# Patient Record
Sex: Male | Born: 1995 | Race: White | Hispanic: No | Marital: Single | Smoking: Former smoker
Health system: Southern US, Community
[De-identification: ages and names within clinical notes are randomized; demographics above are authoritative.]

## PROBLEM LIST (undated history)

## (undated) DIAGNOSIS — J302 Other seasonal allergic rhinitis: Secondary | ICD-10-CM

## (undated) HISTORY — PX: TONSILLECTOMY: SUR1361

## (undated) HISTORY — PX: WRIST SURGERY: SHX841

---
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
## 2013-05-13 IMAGING — CR DG ANKLE COMPLETE 3+V*L*
3 series · 3 of 3 positions shown · non-contrast
Comparison: None.

CLINICAL DATA: Inversion ankle injury.  Pain, swelling.

LEFT ANKLE COMPLETE - 3+ VIEW

[view not recorded (1 of 3)]
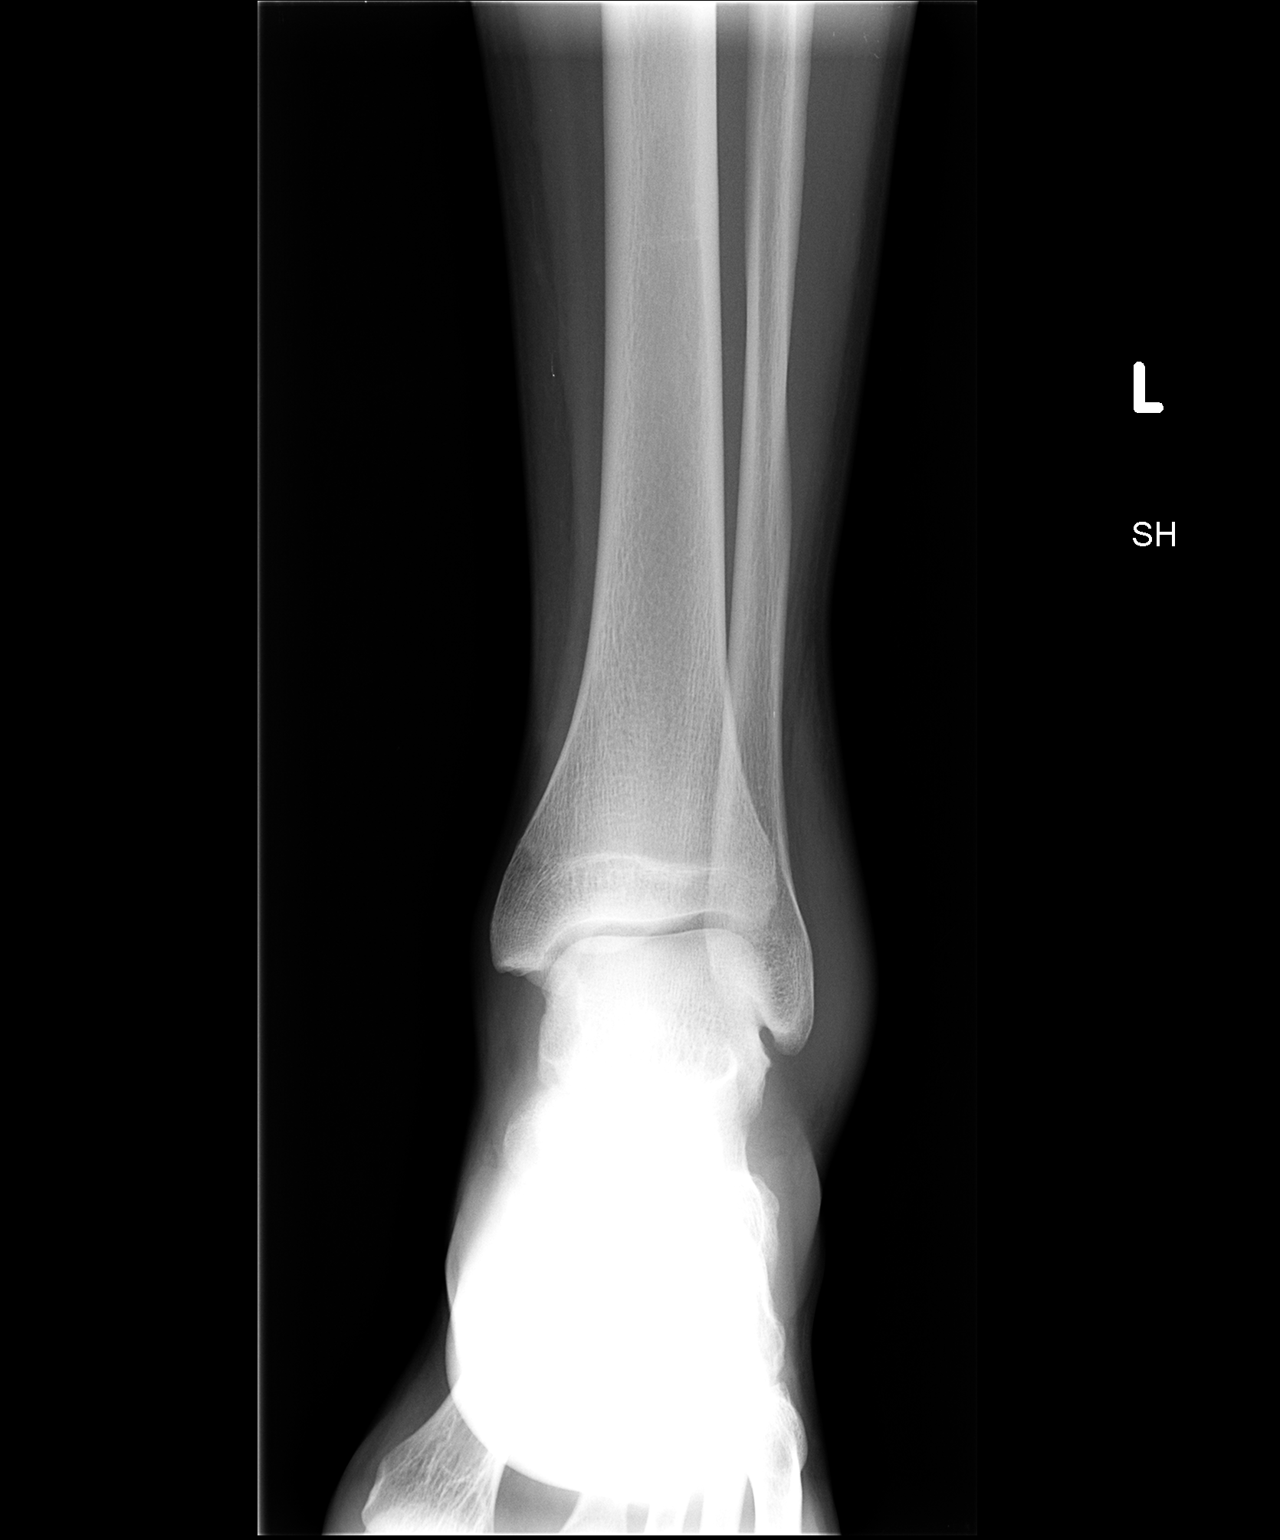

[view not recorded (2 of 3)]
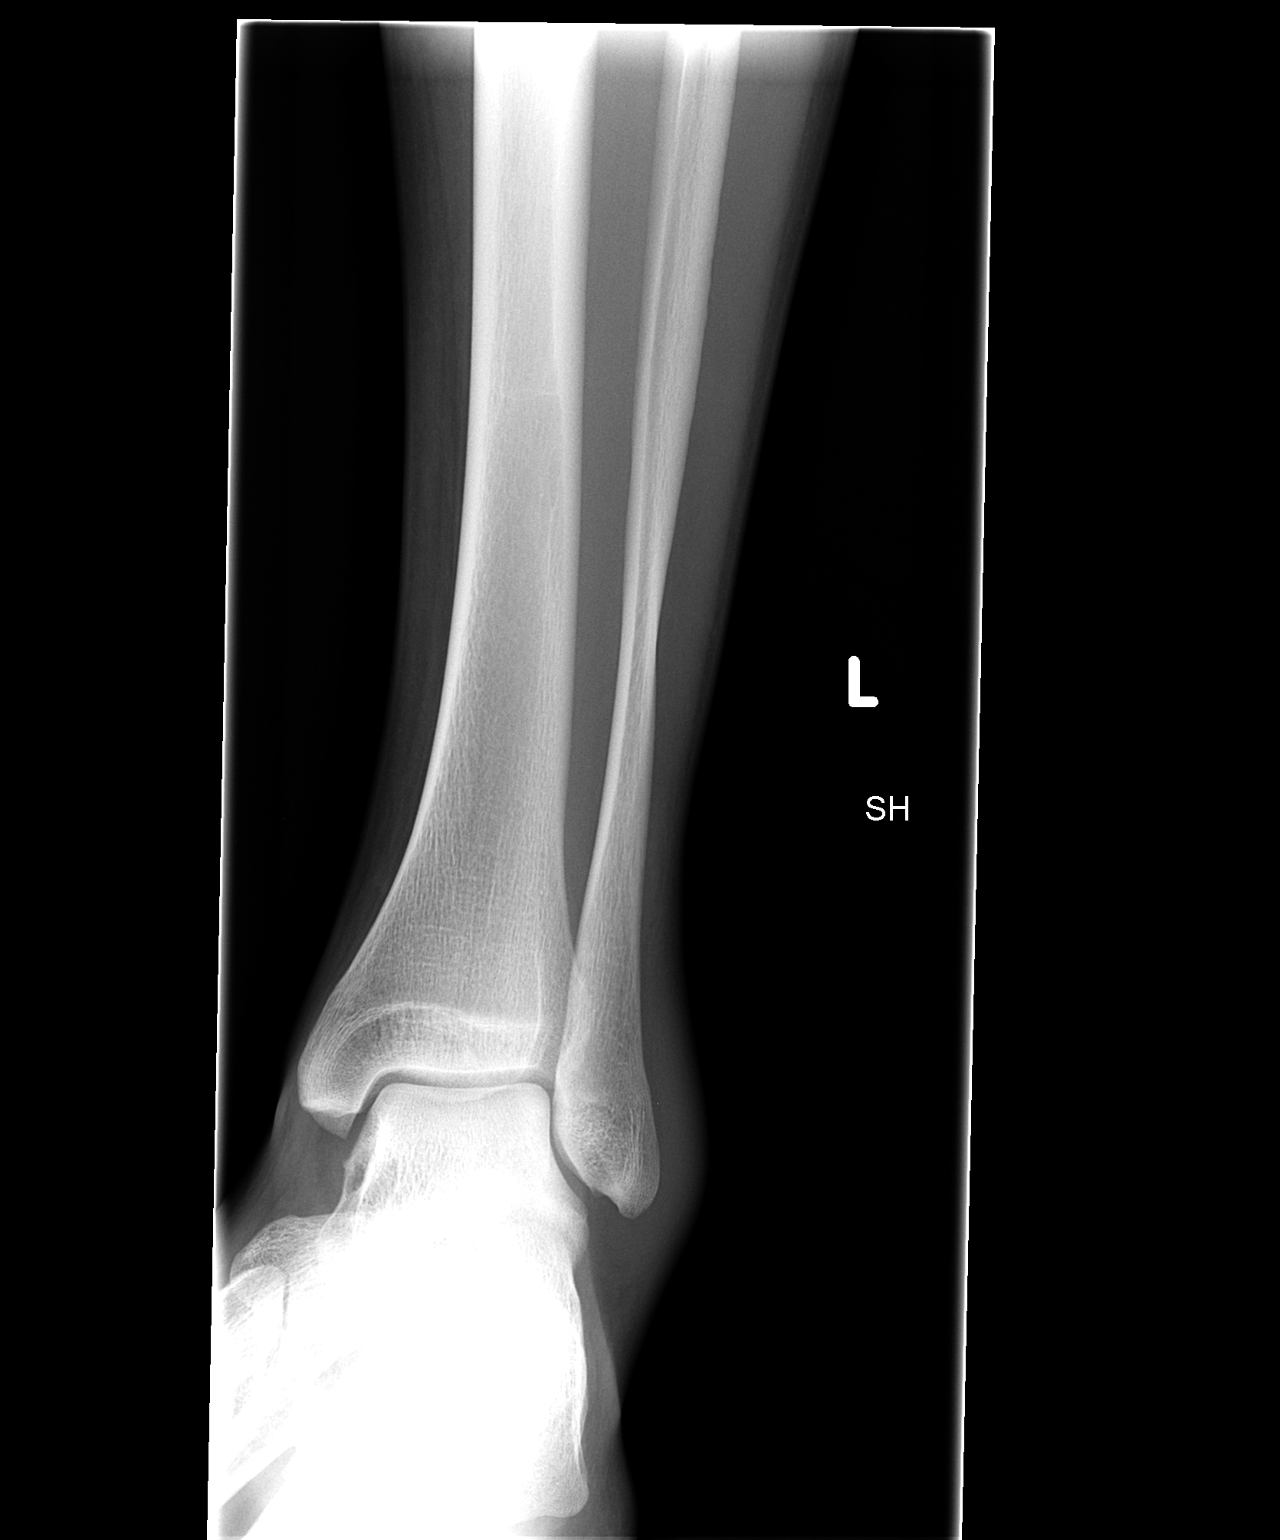

[view not recorded (3 of 3)]
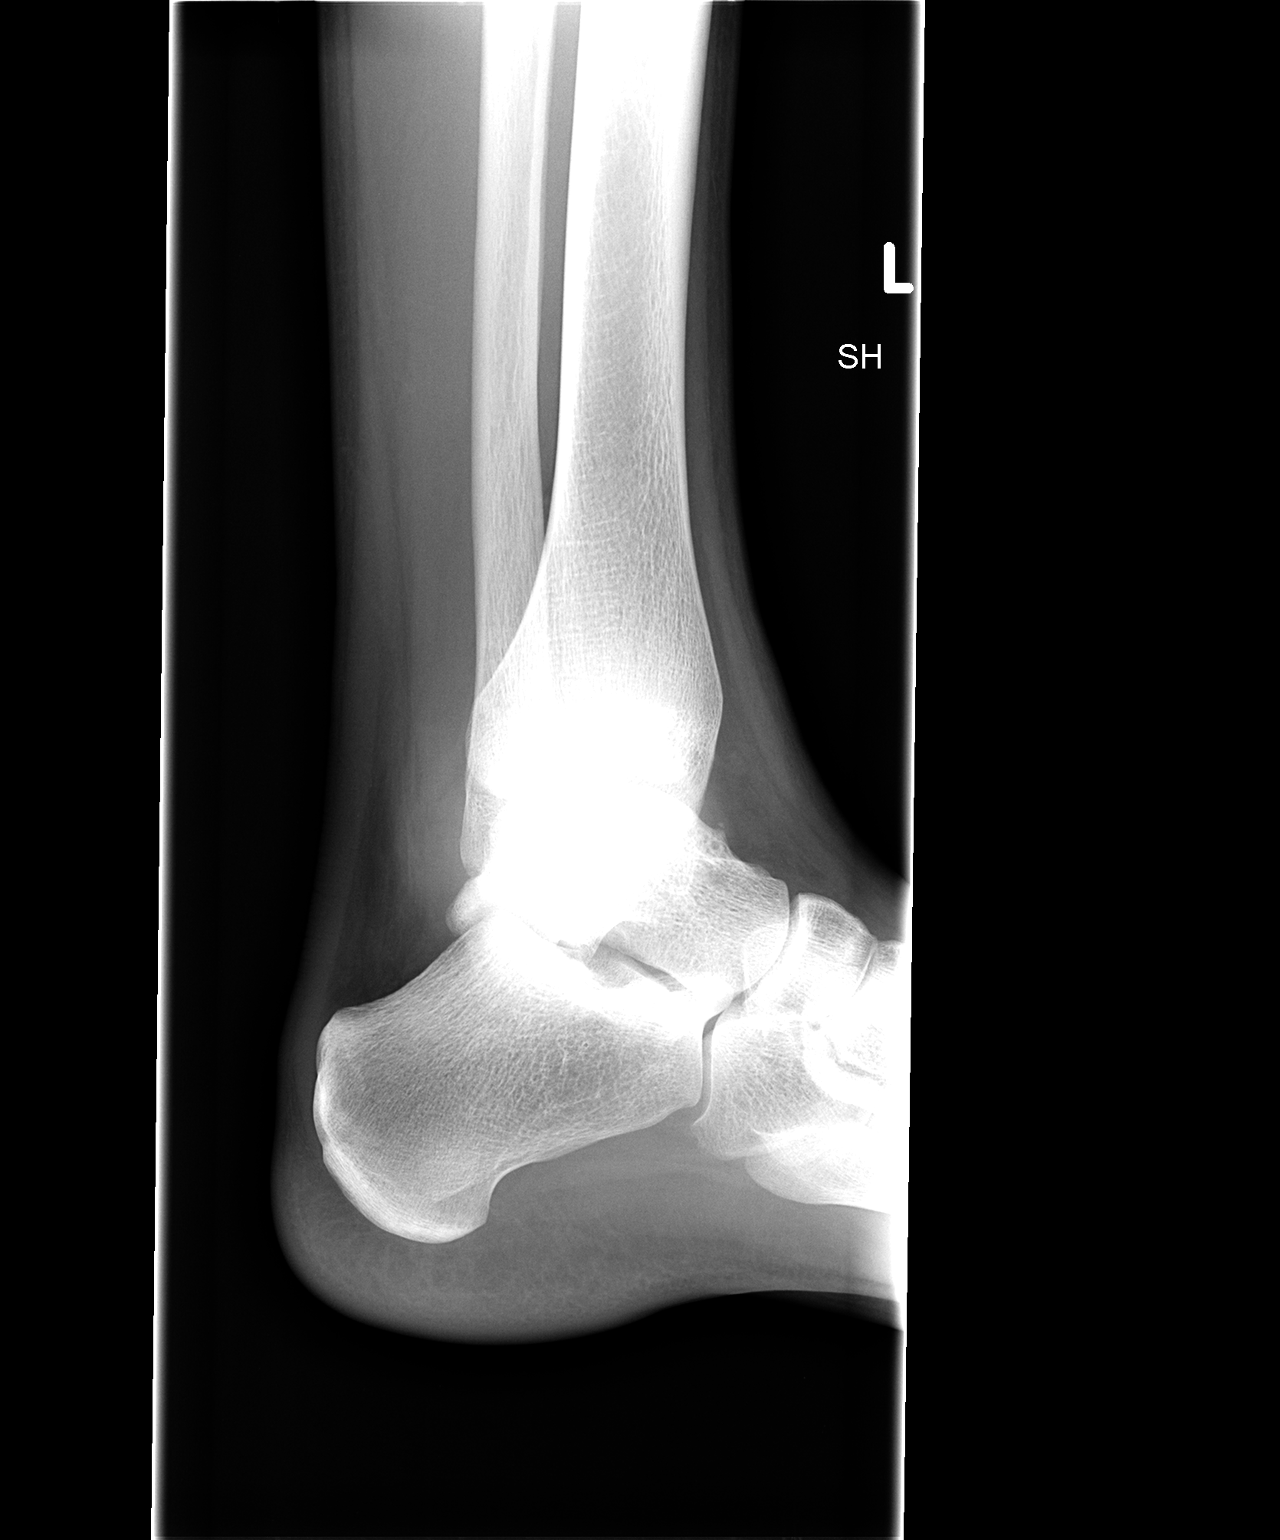

[3 of 3 positions shown; findings below may reference images not displayed]

FINDINGS: Lateral soft tissues.  No underlying bony fracture,
subluxation or dislocation.
IMPRESSION: No acute bony abnormality.

## 2016-03-06 ENCOUNTER — Encounter: Payer: Self-pay | Admitting: Emergency Medicine

## 2016-03-06 ENCOUNTER — Emergency Department
Admission: EM | Admit: 2016-03-06 | Discharge: 2016-03-06 | Disposition: A | Discharge status: Home / Self Care | Payer: Managed Care, Other (non HMO) | Source: Home / Self Care | Attending: Family Medicine | Admitting: Family Medicine

## 2016-03-06 DIAGNOSIS — J019 Acute sinusitis, unspecified: Secondary | ICD-10-CM

## 2016-03-06 MED ORDER — AMOXICILLIN-POT CLAVULANATE 875-125 MG PO TABS: 1.0000 | ORAL_TABLET | Freq: Two times a day (BID) | ORAL | Status: DC

## 2016-03-06 MED ORDER — IBUPROFEN 600 MG PO TABS: 600.0000 mg | ORAL_TABLET | Freq: Four times a day (QID) | ORAL | Status: DC | PRN

## 2016-03-06 MED ORDER — FLUTICASONE PROPIONATE 50 MCG/ACT NA SUSP: 2.0000 | Freq: Every day | NASAL | Status: DC

## 2016-03-06 MED ORDER — PSEUDOEPHEDRINE HCL 60 MG PO TABS: 60.0000 mg | ORAL_TABLET | Freq: Four times a day (QID) | ORAL | Status: DC | PRN

## 2016-03-06 NOTE — Discharge Instructions (Signed)
You may take 400-600mg Ibuprofen (Motrin) every 6-8 hours for fever and pain  °Alternate with Tylenol  °You may take 500mg Tylenol every 4-6 hours as needed for fever and pain  °Follow-up with your primary care provider next week for recheck of symptoms if not improving.  °Be sure to drink plenty of fluids and rest, at least 8hrs of sleep a night, preferably more while you are sick. °Return urgent care or go to closest ER if you cannot keep down fluids/signs of dehydration, fever not reducing with Tylenol, difficulty breathing/wheezing, stiff neck, worsening condition, or other concerns (see below)  °Please take antibiotics as prescribed and be sure to complete entire course even if you start to feel better to ensure infection does not come back. ° °Sinus Rinse °WHAT IS A SINUS RINSE? °A sinus rinse is a home treatment. It rinses your sinuses with a mixture of salt and water (saline solution). Sinuses are air-filled spaces in your skull behind the bones of your face and forehead. They open into your nasal cavity. °To do a sinus rinse, you will need: °· Saline solution. °· Neti pot or spray bottle. This releases the saline solution into your nose and through your sinuses. You can buy neti pots and spray bottles at: °· Your local pharmacy. °· A health food store. °· Online. °WHEN WOULD I DO A SINUS RINSE?  °A sinus rinse can help to clear your nasal cavity. It can clear:  °· Mucus. °· Dirt. °· Dust. °· Pollen. °You may do a sinus rinse when you have: °· A cold. °· A virus. °· Allergies. °· A sinus infection. °· A stuffy nose. °If you are considering a sinus rinse: °· Ask your child's doctor before doing a sinus rinse on your child. °· Do not do a sinus rinse if you have had: °¨ Ear or nasal surgery. °¨ An ear infection. °¨ Blocked ears. °HOW DO I DO A SINUS RINSE?  °· Wash your hands. °· Disinfect your device using the directions that came with the device. °· Dry your device. °· Use the solution that comes with your  device or one that is sold separately in stores. Follow the mixing directions on the package. °· Fill your device with the amount of saline solution as stated in the device instructions. °· Stand over a sink and tilt your head sideways over the sink. °· Place the spout of the device in your upper nostril (the one closer to the ceiling). °· Gently pour or squeeze the saline solution into the nasal cavity. The liquid should drain to the lower nostril if you are not too congested. °· Gently blow your nose. Blowing too hard may cause ear pain. °· Repeat in the other nostril. °· Clean and rinse your device with clean water. °· Air-dry your device. °ARE THERE RISKS OF A SINUS RINSE?  °Sinus rinse is normally very safe and helpful. However, there are a few risks, which include:  °· A burning feeling in the sinuses. This may happen if you do not make the saline solution as instructed. Make sure to follow all directions when making the saline solution. °· Infection from unclean water. This is rare, but possible. °· Nasal irritation. °  °This information is not intended to replace advice given to you by your health care provider. Make sure you discuss any questions you have with your health care provider. °  °Document Released: 05/11/2014 Document Reviewed: 05/11/2014 °Elsevier Interactive Patient Education ©2016 Elsevier Inc. ° °Sinusitis, Adult °  Adult Sinusitis is redness, soreness, and puffiness (inflammation) of the air pockets in the bones of your face (sinuses). The redness, soreness, and puffiness can cause air and mucus to get trapped in your sinuses. This can allow germs to grow and cause an infection.  HOME CARE   Drink enough fluids to keep your pee (urine) clear or pale yellow.  Use a humidifier in your home.  Run a hot shower to create steam in the bathroom. Sit in the bathroom with the door closed. Breathe in the steam 3-4 times a day.  Put a warm, moist washcloth on your face 3-4 times a day, or as told by  your doctor.  Use salt water sprays (saline sprays) to wet the thick fluid in your nose. This can help the sinuses drain.  Only take medicine as told by your doctor. GET HELP RIGHT AWAY IF:   Your pain gets worse.  You have very bad headaches.  You are sick to your stomach (nauseous).  You throw up (vomit).  You are very sleepy (drowsy) all the time.  Your face is puffy (swollen).  Your vision changes.  You have a stiff neck.  You have trouble breathing. MAKE SURE YOU:   Understand these instructions.  Will watch your condition.  Will get help right away if you are not doing well or get worse.   This information is not intended to replace advice given to you by your health care provider. Make sure you discuss any questions you have with your health care provider.   Document Released: 04/01/2008 Document Revised: 11/04/2014 Document Reviewed: 05/19/2012 Elsevier Interactive Patient Education [employer]2016 Elsevier Inc.

## 2016-03-06 NOTE — ED Notes (Signed)
Sinus pain, pressure , headache, chills, sore throat x 3 days

## 2016-03-06 NOTE — ED Provider Notes (Signed)
CSN: [id number]     Arrival date & time 03/06/16  1103 History   First MD Initiated Contact with Patient 03/06/16 1126     Chief Complaint  Patient presents with  . Sinus Problem   (Consider location/radiation/quality/duration/timing/severity/associated sxs/prior Treatment) HPI The pt is a 20yo male presenting [facility] with c/o 3-4 days of gradually worsening sinus pain, pressure, chills, and sore throat. He notes his girlfriend was recently dx with sinusitis. Frontal headache is aching and throbbing, 2/10 at this time. This morning he had a temperature of 99*F.  He does not have medication at home so he has not tried anything yet.    Past Medical History  Diagnosis Date  . Seasonal allergies    History reviewed. No pertinent past surgical history. No family history on file. Social History  Substance Use Topics  . Smoking status: Current Every Day Smoker -- 0.50 packs/day    Types: Cigarettes  . Smokeless tobacco: None  . Alcohol Use: No    Review of Systems  Constitutional: Positive for fever, chills and fatigue. Negative for appetite change.  HENT: Positive for congestion, ear pain ( bilateral pressure), sinus pressure, sneezing and sore throat. Negative for voice change.   Respiratory: Negative for cough and shortness of breath.   Gastrointestinal: Negative for nausea, vomiting, abdominal pain and diarrhea.  Neurological: Positive for headaches. Negative for dizziness and light-headedness.    Allergies  Review of patient's allergies indicates no known allergies.  Home Medications   Prior to Admission medications   Medication Sig Start Date End Date Taking? Authorizing Provider  amoxicillin-clavulanate (AUGMENTIN) 875-125 MG tablet Take 1 tablet by mouth 2 (two) times daily. One po bid x 7 days 03/06/16   Junius FinnerErin O'Malley, PA-C  fluticasone [facility](FLONASE) 50 MCG/ACT nasal spray Place 2 sprays into both nostrils daily. 03/06/16   Junius FinnerErin O'Malley, PA-C  ibuprofen (ADVIL,MOTRIN) 600 MG  tablet Take 1 tablet (600 mg total) by mouth every 6 (six) hours as needed. 03/06/16   Junius FinnerErin O'Malley, PA-C  pseudoephedrine (SUDAFED) 60 MG tablet Take 1 tablet (60 mg total) by mouth every 6 (six) hours as needed. 03/06/16   Junius FinnerErin O'Malley, PA-C   Meds Ordered and Administered this Visit  Medications - No data to display  BP 100/69 mmHg  Pulse 81  Temp(Src) 98.7 F (37.1 C) (Oral)  Ht 6\' 1"  (1.854 m)  Wt 160 lb (72.576 kg)  BMI 21.11 kg/m2  SpO2 100% No data found.   Physical Exam  Constitutional: He appears well-developed and well-nourished. No distress.  HENT:  Head: Normocephalic and atraumatic.  Right Ear: Tympanic membrane normal.  Left Ear: Tympanic membrane normal.  Nose: Mucosal edema present. Right sinus exhibits maxillary sinus tenderness and frontal sinus tenderness. Left sinus exhibits maxillary sinus tenderness and frontal sinus tenderness.  Mouth/Throat: Uvula is midline and mucous membranes are normal. Posterior oropharyngeal erythema present. No oropharyngeal exudate, posterior oropharyngeal edema or tonsillar abscesses.  Eyes: Conjunctivae are normal. No scleral icterus.  Neck: Normal range of motion. Neck supple.  Cardiovascular: Normal rate, regular rhythm and normal heart sounds.   Pulmonary/Chest: Effort normal and breath sounds normal. No stridor. No respiratory distress. He has no wheezes. He has no rales.  Abdominal: Soft. He exhibits no distension. There is no tenderness.  Musculoskeletal: Normal range of motion.  Lymphadenopathy:    He has no cervical adenopathy.  Neurological: He is alert.  Skin: Skin is warm. He is diaphoretic.  Nursing note and vitals reviewed.   ED  Course  Procedures (including critical care time)  Labs Review Labs Reviewed - No data to display  Imaging Review No results found.    MDM   1. Acute rhinosinusitis    Pt c/o worsening sinus symptoms for 3 days. Girlfriend also sick with sinusitis.  Sinus tenderness noted on  exam. Pt reports temp of 99*F this morning, pt mildly diaphoretic on exam.  Rx: augmentin, sudafed, flonase, and motrin.  Home care instructions provided. F/u with PCP in 7-10 days if not improving, sooner if worsening. Patient verbalized understanding and agreement with treatment plan.     Junius Finner, PA-C 03/06/16 1158

## 2016-12-24 ENCOUNTER — Emergency Department
Admission: EM | Admit: 2016-12-24 | Discharge: 2016-12-24 | Disposition: A | Discharge status: Home / Self Care | Payer: Managed Care, Other (non HMO) | Source: Home / Self Care | Attending: Family Medicine | Admitting: Family Medicine

## 2016-12-24 ENCOUNTER — Encounter: Payer: Self-pay | Admitting: *Deleted

## 2016-12-24 DIAGNOSIS — J029 Acute pharyngitis, unspecified: Secondary | ICD-10-CM

## 2016-12-24 LAB — POCT RAPID STREP A (OFFICE): RAPID STREP A SCREEN: NEGATIVE

## 2016-12-24 MED ORDER — OSELTAMIVIR PHOSPHATE 75 MG PO CAPS: 75.0000 mg | ORAL_CAPSULE | Freq: Two times a day (BID) | ORAL | 0 refills | Status: AC

## 2016-12-24 MED ORDER — IBUPROFEN 600 MG PO TABS
600.0000 mg | ORAL_TABLET | Freq: Once | ORAL | Status: AC
  Administered 2016-12-24: 600 mg via ORAL

## 2016-12-24 NOTE — ED Triage Notes (Signed)
Patient c/o 2-3 days of sore throat. Awoke today with worsening sore throat, sweats, HA and fatigue.

## 2016-12-24 NOTE — ED Provider Notes (Signed)
[facility]    CSN: [id number] Arrival date & time: 12/24/16  1153     History   Chief Complaint Chief Complaint  Patient presents with  . Sore Throat    HPI Randy Andrade is a 21 y.o. male.   Complains of 3 day history flu-like illness including myalgias, headache, chills/sweats, fatigue, nasal congestion and sore throat.  No cough.  No pleuritic pain or shortness of breath.    The history is provided by the patient.    Past Medical History:  Diagnosis Date  . Seasonal allergies     There are no active problems to display for this patient.   Past Surgical History:  Procedure Laterality Date  . TONSILLECTOMY    . WRIST SURGERY         Home Medications    Prior to Admission medications   Medication Sig Start Date End Date Taking? Authorizing Provider  oseltamivir (TAMIFLU) 75 MG capsule Take 1 capsule (75 mg total) by mouth every 12 (twelve) hours. 12/24/16   Lattie Haw, MD    Family History History reviewed. No pertinent family history.  Social History Social History  Substance Use Topics  . Smoking status: Current Every Day Smoker    Packs/day: 0.50    Types: Cigarettes  . Smokeless tobacco: Never Used  . Alcohol use No     Allergies   Patient has no known allergies.   Review of Systems Review of Systems + sore throat No cough No pleuritic pain No wheezing + nasal congestion + post-nasal drainage No sinus pain/pressure No itchy/red eyes No earache No hemoptysis No SOB No fever, + chills/sweats No nausea No vomiting No abdominal pain No diarrhea No urinary symptoms No skin rash + fatigue + myalgias + headache Used OTC meds without relief   Physical Exam Triage Vital Signs ED Triage Vitals  Enc Vitals Group     BP 12/24/16 1217 100/66     Pulse Rate 12/24/16 1217 82     Resp 12/24/16 1217 14     Temp 12/24/16 1217 98.4 F (36.9 C)     Temp Source 12/24/16 1217 Oral     SpO2 12/24/16 1217 100 %   Weight 12/24/16 1218 158 lb (71.7 kg)     Height --      Head Circumference --      Peak Flow --      Pain Score 12/24/16 1218 7     Pain Loc --      Pain Edu? --      Excl. in GC? --    No data found.   Updated Vital Signs BP 100/66 (BP Location: Left Arm)   Pulse 82   Temp 98.4 F (36.9 C) (Oral)   Resp 14   Wt 158 lb (71.7 kg)   SpO2 100%   BMI 20.85 kg/m   Visual Acuity Right Eye Distance:   Left Eye Distance:   Bilateral Distance:    Right Eye Near:   Left Eye Near:    Bilateral Near:     Physical Exam Nursing notes and Vital Signs reviewed. Appearance:  Patient appears stated age, and in no acute distress Eyes:  Pupils are equal, round, and reactive to light and accomodation.  Extraocular movement is intact.  Conjunctivae are not inflamed  Ears:  Canals normal.  Tympanic membranes normal.  Nose:  Mildly congested turbinates.  No sinus tenderness.   Pharynx:  Uvula slightly erythematous Neck:  Supple.  Tender  enlarged posterior/lateral nodes are palpated bilaterally  Lungs:  Clear to auscultation.  Breath sounds are equal.  Moving air well. Heart:  Regular rate and rhythm without murmurs, rubs, or gallops.  Abdomen:  Nontender without masses or hepatosplenomegaly.  Bowel sounds are present.  No CVA or flank tenderness.  Extremities:  No edema.  Skin:  No rash present.    UC Treatments / Results  Labs (all labs ordered are listed, but only abnormal results are displayed) Labs Reviewed  POCT RAPID STREP A (OFFICE) negative    EKG  EKG Interpretation None       Radiology No results found.  Procedures Procedures (including critical care time)  Medications Ordered in UC Medications  ibuprofen (ADVIL,MOTRIN) tablet 600 mg (600 mg Oral Given 12/24/16 1219)     Initial Impression / Assessment and Plan / UC Course  I have reviewed the triage vital signs and the nursing notes.  Pertinent labs & imaging results that were available during my care of  the patient were reviewed by me and considered in my medical decision making (see chart for details).    ? Influenza. Begin empiric Tamiflu. Take plain guaifenesin (1200mg  extended release tabs such as Mucinex) twice daily, with plenty of water, for cough and congestion.  May add Pseudoephedrine (30mg , one or two every 4 to 6 hours) for sinus congestion.  Get adequate rest.   May take Delsym Cough Suppressant at bedtime for nighttime cough.  Try warm salt water gargles for sore throat.  Stop all antihistamines for now, and other non-prescription cough/cold preparations. May take Ibuprofen 200mg , 4 tabs every 8 hours with food for sore throat, fever, body aches, etc. Followup with Family Doctor if not improved in one week.     Final Clinical Impressions(s) / UC Diagnoses   Final diagnoses:  Pharyngitis, unspecified etiology    New Prescriptions New Prescriptions   OSELTAMIVIR (TAMIFLU) 75 MG CAPSULE    Take 1 capsule (75 mg total) by mouth every 12 (twelve) hours.     Lattie HawStephen A Yoali Conry, MD 12/24/16 1242

## 2016-12-24 NOTE — Discharge Instructions (Signed)
Take plain guaifenesin (1200mg  extended release tabs such as Mucinex) twice daily, with plenty of water, for cough and congestion.  May add Pseudoephedrine (30mg , one or two every 4 to 6 hours) for sinus congestion.  Get adequate rest.   May take Delsym Cough Suppressant at bedtime for nighttime cough.  Try warm salt water gargles for sore throat.  Stop all antihistamines for now, and other non-prescription cough/cold preparations. May take Ibuprofen 200mg , 4 tabs every 8 hours with food for sore throat, fever, body aches, etc.

## 2024-02-10 ENCOUNTER — Ambulatory Visit (INDEPENDENT_AMBULATORY_CARE_PROVIDER_SITE_OTHER)

## 2024-02-10 ENCOUNTER — Ambulatory Visit: Admitting: Sports Medicine

## 2024-02-10 ENCOUNTER — Ambulatory Visit: Admission: EM | Admit: 2024-02-10 | Discharge: 2024-02-10 | Disposition: A | Discharge status: Home / Self Care

## 2024-02-10 ENCOUNTER — Encounter: Payer: Self-pay | Admitting: Sports Medicine

## 2024-02-10 VITALS — BP 110/79 | Ht 73.0 in | Wt 160.0 lb

## 2024-02-10 DIAGNOSIS — Y9366 Activity, soccer: Secondary | ICD-10-CM

## 2024-02-10 DIAGNOSIS — S99912A Unspecified injury of left ankle, initial encounter: Secondary | ICD-10-CM

## 2024-02-10 DIAGNOSIS — M25572 Pain in left ankle and joints of left foot: Secondary | ICD-10-CM

## 2024-02-10 DIAGNOSIS — W228XXA Striking against or struck by other objects, initial encounter: Secondary | ICD-10-CM

## 2024-02-10 MED ORDER — CELECOXIB 200 MG PO CAPS: 200.0000 mg | ORAL_CAPSULE | Freq: Every day | ORAL | 0 refills | Status: AC

## 2024-02-10 NOTE — ED Triage Notes (Addendum)
 [facility] with [facility] left ankle injury while playing soccer with his daughter on sunday. Pt reports pain is a tingling sensation and comes and goes. Pt reports he took otc pain meds and RICE

## 2024-02-10 NOTE — ED Provider Notes (Signed)
 [facility]    CSN: [id number] Arrival date & time: 02/10/24  1011      History   Chief Complaint Chief Complaint  Patient presents with   Ankle Pain    HPI Randy Andrade is a 28 y.o. male.   HPI Randy Andrade 28 year old male presents with left ankle pain secondary to left ankle injury while playing soccer with his daughter on Sunday, 02/08/2024.  Patient reports taking OTC pain medication and RICE to left ankle.  Past Medical History:  Diagnosis Date   Seasonal allergies     There are no active problems to display for this patient.   Past Surgical History:  Procedure Laterality Date   TONSILLECTOMY     WRIST SURGERY         Home Medications    Prior to Admission medications   Medication Sig Start Date End Date Taking? Authorizing Provider  amphetamine-dextroamphetamine (ADDERALL XR) 30 MG 24 hr capsule Take 30 mg by mouth daily. 01/17/24  Yes [provider]  amphetamine-dextroamphetamine (ADDERALL) 30 MG tablet Take 1 tablet by mouth daily. 01/17/24  Yes [provider]  celecoxib (CELEBREX) 200 MG capsule Take 1 capsule (200 mg total) by mouth daily for 15 days. 02/10/24 02/25/24 Yes Leonides Ramp, FNP  oseltamivir (TAMIFLU) 75 MG capsule Take 1 capsule (75 mg total) by mouth every 12 (twelve) hours. Patient not taking: Reported on 02/10/2024 12/24/16   Leon Rajas, MD    Family History Family History  Problem Relation Age of Onset   Healthy Mother    Healthy Father     Social History Social History   Tobacco Use   Smoking status: Former    Current packs/day: 0.50    Types: Cigarettes   Smokeless tobacco: Never  Substance Use Topics   Alcohol use: No   Drug use: No     Allergies   Patient has no known allergies.   Review of Systems Review of Systems  Musculoskeletal:        Left ankle pain secondary to left ankle injury on Sunday, 02/08/2024     Physical Exam Triage Vital Signs ED Triage Vitals  Encounter  Vitals Group     BP 02/10/24 1046 110/79     Systolic BP Percentile --      Diastolic BP Percentile --      Pulse Rate 02/10/24 1046 80     Resp 02/10/24 1046 16     Temp 02/10/24 1046 98.2 F (36.8 C)     Temp src --      SpO2 02/10/24 1046 98 %     Weight --      Height --      Head Circumference --      Peak Flow --      Pain Score 02/10/24 1044 3     Pain Loc --      Pain Education --      Exclude from Growth Chart --    No data found.  Updated Vital Signs BP 110/79   Pulse 80   Temp 98.2 F (36.8 C)   Resp 16   SpO2 98%    Physical Exam Vitals and nursing note reviewed.  Constitutional:      Appearance: Normal appearance. He is normal weight.  HENT:     Head: Normocephalic and atraumatic.     Mouth/Throat:     Mouth: Mucous membranes are moist.     Pharynx: Oropharynx is clear.  Eyes:  Extraocular Movements: Extraocular movements intact.     Conjunctiva/sclera: Conjunctivae normal.     Pupils: Pupils are equal, round, and reactive to light.  Cardiovascular:     Rate and Rhythm: Normal rate and regular rhythm.     Pulses: Normal pulses.     Heart sounds: Normal heart sounds.  Pulmonary:     Effort: Pulmonary effort is normal.     Breath sounds: Normal breath sounds. No wheezing, rhonchi or rales.  Musculoskeletal:        General: Normal range of motion.  Skin:    General: Skin is warm and dry.  Neurological:     General: No focal deficit present.     Mental Status: He is alert and oriented to person, place, and time. Mental status is at baseline.  Psychiatric:        Mood and Affect: Mood normal.        Behavior: Behavior normal.      UC Treatments / Results  Labs (all labs ordered are listed, but only abnormal results are displayed) Labs Reviewed - No data to display  EKG   Radiology DG Ankle Complete Left Result Date: 02/10/2024 CLINICAL DATA:  Left ankle injury while playing soccer with daughter 2 days ago. Lateral pain. EXAM: LEFT  ANKLE COMPLETE - 3+ VIEW COMPARISON:  Left ankle radiographs 02/11/2012 FINDINGS: Normal bone mineralization. The ankle mortise is symmetric and intact. Mild dorsal talar dome-neck junction degenerative spurring is mildly increased from prior remote 2013 radiographs. Minimal interval increase in size of a chronic 4 mm ossicle at the dorsal aspect of the spur. Minimal lateral malleolar soft tissue swelling, decreased from the moderate lateral malleolar soft tissue swelling on remote prior radiographs. No acute fracture is seen. No dislocation. IMPRESSION: 1. Minimal lateral malleolar soft tissue swelling, decreased from the moderate lateral malleolar soft tissue swelling on remote prior radiographs. No acute fracture is seen. 2. Mild dorsal talar dome-neck junction degenerative spurring, mildly increased from prior remote 2013 radiographs. Electronically Signed   By: Bertina Broccoli M.D.   On: 02/10/2024 12:20    Procedures Procedures (including critical care time)  Medications Ordered in UC Medications - No data to display  Initial Impression / Assessment and Plan / UC Course  I have reviewed the triage vital signs and the nursing notes.  Pertinent labs & imaging results that were available during my care of the patient were reviewed by me and considered in my medical decision making (see chart for details).     MDM: 1.  Acute left ankle pain-left ankle x-ray results revealed above, patient notified; 2.  Injury of left ankle, initial encounter-Rx'd Celebrex 200 mg capsule: Take 1 capsule daily x 15 days, Ace wrap placed on left ankle prior to discharge. Advised patient to take medication as directed with food to completion.  Encouraged to increase daily water intake to 64 ounces per day while taking this medication.  Advised patient to RICE affected area of left ankle for 30 minutes 3 times daily for the next 3 days.  Advised if symptoms worsen and/or unresolved please follow-up with [facility] for further evaluation.  Contact information provided with his AVS today. Final Clinical Impressions(s) / UC Diagnoses   Final diagnoses:  Injury of left ankle, initial encounter  Acute left ankle pain     Discharge Instructions      Advised patient to take medication as directed with food to completion.  Encouraged to increase daily water intake to  64 ounces per day while taking this medication.  Advised patient to RICE affected area of left ankle for 30 minutes 3 times daily for the next 3 days.  Advised if symptoms worsen and/or unresolved please follow-up with [facility] for further evaluation.  Contact information provided with his AVS today.     ED Prescriptions     Medication Sig Dispense Auth. Provider   celecoxib (CELEBREX) 200 MG capsule Take 1 capsule (200 mg total) by mouth daily for 15 days. 15 capsule Darrian Grzelak, FNP      PDMP not reviewed this encounter.   Leonides Ramp, FNP 02/10/24 1257

## 2024-02-10 NOTE — Progress Notes (Signed)
   Subjective:    Patient ID: Randy Andrade, male    DOB: 1996/06/19, 28 y.o.   MRN: [id number]  HPI chief complaint: Left ankle pain  Patient is a very pleasant 28 year old male that presents today with left ankle pain that began while playing soccer 2 days ago.  He denies any sort of inversion or eversion injury to the ankle.  He describes a pop along the lateral ankle that occurred when simply planting his foot.  He developed some swelling shortly thereafter.  Since then he is describing some instability along the lateral ankle.  He was seen earlier today at a local urgent care.  X-rays of the left ankle showed no osseous abnormality.  It does show some mild lateral soft tissue swelling.  No pain medially.  He has suffered several ankle sprains in the past but today's pain is different than what he is experienced with those previously.    Review of Systems As above    Objective:   Physical Exam  Well-developed, well-nourished.  No acute distress  Left ankle: There is some mild soft tissue swelling diffusely along the lateral ankle.  He is tender to palpation along the posterior lateral edge of the malleolus as well as along the peroneal tendons.  I do not appreciate any active subluxation or dislocation with ankle range of motion.  Positive anterior drawer, 2-3+ talar tilt.  Good pulses.  X-rays of the left ankle are as above      Assessment & Plan:   Left ankle pain-question subluxing/dislocating peroneal tendon Chronic left ankle instability  Patient's history is suggestive of a subluxing/dislocating peroneal tendon.  He is placed into a short cam walker and I will refer him to Dr. Hulda Mage for further workup and treatment.  In the meantime, he will remain out of work until that appointment with Dr. Hulda Mage.  Follow-up with me as needed.  This note was dictated using Dragon naturally speaking software and may contain errors in syntax, spelling, or content which have not been  identified prior to signing this note.

## 2024-02-10 NOTE — Discharge Instructions (Addendum)
 Advised patient to take medication as directed with food to completion.  Encouraged to increase daily water intake to 64 ounces per day while taking this medication.  Advised patient to RICE affected area of left ankle for 30 minutes 3 times daily for the next 3 days.  Advised if symptoms worsen and/or unresolved please follow-up with [facility] for further evaluation.  Contact information provided with his AVS today.
